# Patient Record
Sex: Female | Born: 2000
Health system: Southern US, Community
[De-identification: ages and names within clinical notes are randomized; demographics above are authoritative.]

## PROBLEM LIST (undated history)

## (undated) DIAGNOSIS — F329 Major depressive disorder, single episode, unspecified: Secondary | ICD-10-CM

## (undated) DIAGNOSIS — F419 Anxiety disorder, unspecified: Secondary | ICD-10-CM

## (undated) DIAGNOSIS — F4310 Post-traumatic stress disorder, unspecified: Secondary | ICD-10-CM

## (undated) DIAGNOSIS — F32A Depression, unspecified: Secondary | ICD-10-CM

---
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
## 2019-06-23 ENCOUNTER — Other Ambulatory Visit: Payer: Self-pay

## 2019-06-23 ENCOUNTER — Emergency Department (HOSPITAL_BASED_OUTPATIENT_CLINIC_OR_DEPARTMENT_OTHER): Payer: No Typology Code available for payment source

## 2019-06-23 ENCOUNTER — Encounter (HOSPITAL_BASED_OUTPATIENT_CLINIC_OR_DEPARTMENT_OTHER): Payer: Self-pay

## 2019-06-23 ENCOUNTER — Emergency Department (HOSPITAL_BASED_OUTPATIENT_CLINIC_OR_DEPARTMENT_OTHER)
Admission: EM | Admit: 2019-06-23 | Discharge: 2019-06-24 | Disposition: A | Discharge status: Home / Self Care | Payer: No Typology Code available for payment source | Attending: Emergency Medicine | Admitting: Emergency Medicine

## 2019-06-23 DIAGNOSIS — R1084 Generalized abdominal pain: Secondary | ICD-10-CM | POA: Diagnosis not present

## 2019-06-23 DIAGNOSIS — R112 Nausea with vomiting, unspecified: Secondary | ICD-10-CM | POA: Insufficient documentation

## 2019-06-23 DIAGNOSIS — F17210 Nicotine dependence, cigarettes, uncomplicated: Secondary | ICD-10-CM | POA: Insufficient documentation

## 2019-06-23 HISTORY — DX: Depression, unspecified: F32.A

## 2019-06-23 HISTORY — DX: Post-traumatic stress disorder, unspecified: F43.10

## 2019-06-23 HISTORY — DX: Anxiety disorder, unspecified: F41.9

## 2019-06-23 LAB — URINALYSIS, MICROSCOPIC (REFLEX)

## 2019-06-23 LAB — BASIC METABOLIC PANEL
Anion gap: 10 (ref 5–15)
BUN: 13 mg/dL (ref 6–20)
CO2: 22 mmol/L (ref 22–32)
Calcium: 9 mg/dL (ref 8.9–10.3)
Chloride: 103 mmol/L (ref 98–111)
Creatinine, Ser: 0.61 mg/dL (ref 0.44–1.00)
GFR calc Af Amer: >60 mL/min (ref >60)
GFR calc non Af Amer: >60 mL/min (ref >60)
Glucose, Bld: 95 mg/dL (ref 70–99)
Potassium: 4.1 mmol/L (ref 3.5–5.1)
Sodium: 135 mmol/L (ref 135–145)

## 2019-06-23 LAB — URINALYSIS, ROUTINE W REFLEX MICROSCOPIC
Bilirubin Urine: NEGATIVE
Glucose, UA: NEGATIVE mg/dL
Ketones, ur: 15 mg/dL — AB
Leukocytes,Ua: NEGATIVE
Nitrite: NEGATIVE
Protein, ur: NEGATIVE mg/dL
Specific Gravity, Urine: >1.03 — ABNORMAL HIGH (ref 1.005–1.030)
pH: 6 (ref 5.0–8.0)

## 2019-06-23 LAB — CBC
HCT: 47 % — ABNORMAL HIGH (ref 36.0–46.0)
Hemoglobin: 15 g/dL (ref 12.0–15.0)
MCH: 28.5 pg (ref 26.0–34.0)
MCHC: 31.9 g/dL (ref 30.0–36.0)
MCV: 89.4 fL (ref 80.0–100.0)
Platelets: 260 10*3/uL (ref 150–400)
RBC: 5.26 MIL/uL — ABNORMAL HIGH (ref 3.87–5.11)
RDW: 13.2 % (ref 11.5–15.5)
WBC: 11.8 10*3/uL — ABNORMAL HIGH (ref 4.0–10.5)
nRBC: 0 % (ref 0.0–0.2)

## 2019-06-23 LAB — PREGNANCY, URINE: Preg Test, Ur: NEGATIVE

## 2019-06-23 MED ORDER — ONDANSETRON HCL 4 MG/2ML IJ SOLN
4.0000 mg | Freq: Once | INTRAMUSCULAR | Status: AC
  Administered 2019-06-23: 4 mg via INTRAVENOUS
  Filled 2019-06-23: qty 2

## 2019-06-23 MED ORDER — MORPHINE SULFATE (PF) 4 MG/ML IV SOLN
4.0000 mg | Freq: Once | INTRAVENOUS | Status: AC
  Administered 2019-06-23: 4 mg via INTRAVENOUS
  Filled 2019-06-23: qty 1

## 2019-06-23 NOTE — ED Triage Notes (Signed)
Pt began vomiting approx 1400 today, expresses CP when vomiting. 6x vomiting, 1x diarrhea. Headache "all over" and abd pain central that radiates to sides.

## 2019-06-23 NOTE — ED Notes (Signed)
Pt unable to urinate at this time.  

## 2019-06-23 NOTE — ED Notes (Signed)
Attempted IV in Right AC unsuccessfully.

## 2019-06-24 MED ORDER — ONDANSETRON HCL 4 MG/2ML IJ SOLN
4.0000 mg | Freq: Once | INTRAMUSCULAR | Status: AC
  Administered 2019-06-24: 4 mg via INTRAVENOUS

## 2019-06-24 MED ORDER — IOHEXOL 300 MG/ML  SOLN
100.0000 mL | Freq: Once | INTRAMUSCULAR | Status: AC | PRN
  Administered 2019-06-24: 100 mL via INTRAVENOUS

## 2019-06-24 MED ORDER — ONDANSETRON HCL 4 MG/2ML IJ SOLN
INTRAMUSCULAR | Status: AC
  Filled 2019-06-24: qty 2

## 2019-06-24 MED ORDER — ONDANSETRON 8 MG PO TBDP: 8.0000 mg | ORAL_TABLET | Freq: Three times a day (TID) | ORAL | 0 refills | Status: AC | PRN

## 2019-06-24 NOTE — Discharge Instructions (Signed)
We saw you in the ER for the abdominal pain. °All the results in the ER are normal, labs and imaging. °We are not sure what is causing your symptoms. ° °The workup in the ER is not complete, and is limited to screening for life threatening and emergent conditions only, so please see a primary care doctor for further evaluation. ° °

## 2019-06-24 NOTE — ED Provider Notes (Signed)
[facility] EMERGENCY DEPARTMENT Provider Note   CSN: [id number] Arrival date & time: 06/23/19  2219     History Chief Complaint  Patient presents with  . Emesis    Carrie Fuller is a 19 y.o. female.  HPI    19 year old female comes in a chief complaint of abdominal pain, nausea and vomiting.  Patient reports that she started having abdominal pain this afternoon around 2 PM.  The pain is generalized, started in the epigastric region and has moved to the lower quadrants.  She has had associated vomiting x6 and one episode of loose bowel movement.  Patient is also having headache.  She denies any fevers, chills.  Patient denies any UTI-like symptoms.  She has no vaginal discharge and is currently on her period.  She denies any high risk sexual behavior or history of STDs.  No history of abdominal surgery or history of pelvic disorder.  Past Medical History:  Diagnosis Date  . Anxiety   . Depression   . PTSD (post-traumatic stress disorder)     Patient Active Problem List   Diagnosis Date Noted  . MDD (major depressive disorder), recurrent episode, severe (HCC) 09/29/2018  . Major depressive disorder, recurrent episode, severe, with psychotic behavior (HCC) 09/29/2018  . Left wrist injury 07/03/2011    History reviewed. No pertinent surgical history.   OB History   No obstetric history on file.     Family History  Problem Relation Age of Onset  . Sudden death Neg Hx   . Hypertension Neg Hx   . Hyperlipidemia Neg Hx   . Heart attack Neg Hx   . Diabetes Neg Hx     Social History   Tobacco Use  . Smoking status: Current Every Day Smoker    Packs/day: 2.00    Types: Cigarettes  . Smokeless tobacco: Never Used  Substance Use Topics  . Alcohol use: Yes  . Drug use: Not Currently    Types: Marijuana    Comment: former cocaine abuse    Home Medications Prior to Admission medications   Medication Sig Start Date End Date Taking? Authorizing Provider    escitalopram (LEXAPRO) 20 MG tablet Take 1 tablet (20 mg total) by mouth daily. 10/02/18  Yes Malvin Johns, MD  sertraline (ZOLOFT) 25 MG tablet Take 25 mg by mouth daily.   Yes [provider]  ondansetron (ZOFRAN ODT) 8 MG disintegrating tablet Take 1 tablet (8 mg total) by mouth every 8 (eight) hours as needed for nausea. 06/24/19   Derwood Kaplan, MD  Prenat Vit-Fe Gly Cys-FA-Omega (ENBRACE HR) CAPS Take 1 capsule by mouth daily at 12 noon. May get from [email or web address] 10/01/18   Malvin Johns, MD    Allergies    Patient has no known allergies.  Review of Systems   Review of Systems  Constitutional: Positive for activity change.  Gastrointestinal: Positive for abdominal pain, nausea and vomiting.  Genitourinary: Negative for dysuria.  All other systems reviewed and are negative.   Physical Exam Updated Vital Signs BP 103/86 (BP Location: Right Arm)   Pulse (!) 107   Temp 98.2 F (36.8 C) (Oral)   Resp (!) 22   Ht 5\' 5"  (1.651 m)   Wt 90.7 kg   LMP 06/23/2019   SpO2 100%   BMI 33.28 kg/m   Physical Exam Vitals and nursing note reviewed.  Constitutional:      Appearance: She is well-developed.  HENT:     Head: Normocephalic and atraumatic.  Cardiovascular:     Rate and Rhythm: Normal rate.  Pulmonary:     Effort: Pulmonary effort is normal.  Abdominal:     General: Bowel sounds are normal.     Tenderness: There is abdominal tenderness. There is guarding.     Comments: Patient has tenderness over the right upper and right lower quadrant with positive McBurney's.  Musculoskeletal:     Cervical back: Normal range of motion and neck supple.  Skin:    General: Skin is warm and dry.  Neurological:     Mental Status: She is alert and oriented to person, place, and time.     ED Results / Procedures / Treatments   Labs (all labs ordered are listed, but only abnormal results are displayed) Labs Reviewed  CBC - Abnormal; Notable for the following components:       Result Value   WBC 11.8 (*)    RBC 5.26 (*)    HCT 47.0 (*)    All other components within normal limits  URINALYSIS, ROUTINE W REFLEX MICROSCOPIC - Abnormal; Notable for the following components:   Specific Gravity, Urine >1.030 (*)    Hgb urine dipstick TRACE (*)    Ketones, ur 15 (*)    All other components within normal limits  URINALYSIS, MICROSCOPIC (REFLEX) - Abnormal; Notable for the following components:   Bacteria, UA FEW (*)    All other components within normal limits  BASIC METABOLIC PANEL  PREGNANCY, URINE    EKG None  Radiology CT ABDOMEN PELVIS W CONTRAST  Result Date: 06/24/2019 CLINICAL DATA:  Right lower quadrant pain for several hours with nausea and vomiting EXAM: CT ABDOMEN AND PELVIS WITH CONTRAST TECHNIQUE: Multidetector CT imaging of the abdomen and pelvis was performed using the standard protocol following bolus administration of intravenous contrast. CONTRAST:  187mL OMNIPAQUE IOHEXOL 300 MG/ML  SOLN COMPARISON:  None. FINDINGS: Lower chest: No acute abnormality. Hepatobiliary: No focal liver abnormality is seen. No gallstones, gallbladder wall thickening, or biliary dilatation. Pancreas: Unremarkable. No pancreatic ductal dilatation or surrounding inflammatory changes. Spleen: Normal in size without focal abnormality. Adrenals/Urinary Tract: Adrenal glands are within normal limits. Kidneys demonstrate a normal enhancement pattern bilaterally. No renal calculi or obstructive changes are seen. The ureters are unremarkable. The bladder is decompressed. Stomach/Bowel: The appendix is well visualized and within normal limits. No obstructive or inflammatory changes of the colon are seen. Small bowel and stomach is within normal limits. Vascular/Lymphatic: No significant vascular findings are present. No enlarged abdominal or pelvic lymph nodes. Reproductive: Uterus is within normal limits. Dominant 2 cm cyst is noted in the right ovary. No changes to suggest free fluid  are noted. Tampon is noted in the vaginal wall. Other: No abdominal wall hernia or abnormality. No abdominopelvic ascites. Musculoskeletal: L5 pars defects are noted bilaterally. No significant anterolisthesis is seen. No other bony abnormality is noted. IMPRESSION: Normal-appearing appendix. 2 cm right ovarian cyst without complicating factors. 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 Electronically Signed   By: Inez Catalina M.D.   On: 06/24/2019 00:45    Procedures Procedures (including critical care time)  Medications Ordered in ED Medications  ondansetron (ZOFRAN) injection 4 mg (has no administration in time range)  ondansetron (ZOFRAN) injection 4 mg (4 mg Intravenous Given 06/23/19 2355)  morphine 4 MG/ML injection 4 mg (4 mg Intravenous Given 06/23/19 2356)  iohexol (OMNIPAQUE) 300 MG/ML solution 100 mL (100 mLs Intravenous Contrast Given 06/24/19 0031)    ED Course  I have reviewed the triage vital signs and the nursing notes.  Pertinent labs & imaging results that were available during my care of the patient were reviewed by me and considered in my medical decision making (see chart for details).    MDM Rules/Calculators/A&P                      19 year old female comes in a chief complaint of abdominal pain. She is complaining of generalized abdominal pain, however on exam she is noted to have right lower quadrant tenderness.  She also reports that the pain is radiated more towards the lower part of her abdomen.  She has no UTI-like symptoms and denies any vaginal discharge or hypersexual behavior.  No clinical concerns for STD after the history.  We decided to proceed with CT scan to rule out appendicitis and it is negative.  P.o. challenge has been passed by patient.  She was reassessed and reported that her pain had resolved.   She is stable for discharge with strict ER return precautions.  Final Clinical Impression(s) / ED Diagnoses Final diagnoses:    Non-intractable vomiting with nausea, unspecified vomiting type  Generalized abdominal pain    Rx / DC Orders ED Discharge Orders         Ordered    ondansetron (ZOFRAN ODT) 8 MG disintegrating tablet  Every 8 hours PRN     06/24/19 0101           Derwood Kaplan, MD 06/24/19 0121

## 2019-06-24 NOTE — ED Notes (Signed)
Pt transported to CT ?

## 2020-09-14 ENCOUNTER — Emergency Department (HOSPITAL_BASED_OUTPATIENT_CLINIC_OR_DEPARTMENT_OTHER)
Admission: EM | Admit: 2020-09-14 | Discharge: 2020-09-14 | Disposition: A | Discharge status: Home / Self Care | Payer: Medicaid Other | Attending: Emergency Medicine | Admitting: Emergency Medicine

## 2020-09-14 ENCOUNTER — Other Ambulatory Visit: Payer: Self-pay

## 2020-09-14 ENCOUNTER — Encounter (HOSPITAL_BASED_OUTPATIENT_CLINIC_OR_DEPARTMENT_OTHER): Payer: Self-pay

## 2020-09-14 ENCOUNTER — Emergency Department (HOSPITAL_BASED_OUTPATIENT_CLINIC_OR_DEPARTMENT_OTHER): Payer: Medicaid Other

## 2020-09-14 DIAGNOSIS — F17210 Nicotine dependence, cigarettes, uncomplicated: Secondary | ICD-10-CM | POA: Insufficient documentation

## 2020-09-14 DIAGNOSIS — M25531 Pain in right wrist: Secondary | ICD-10-CM | POA: Insufficient documentation

## 2020-09-14 MED ORDER — NAPROXEN 500 MG PO TABS: 500.0000 mg | ORAL_TABLET | Freq: Two times a day (BID) | ORAL | 0 refills | Status: AC

## 2020-09-14 NOTE — ED Triage Notes (Signed)
Pt c/o pain to right wrist x 3 weeks-denies injury-NAD-steady gait

## 2020-09-14 NOTE — ED Notes (Signed)
Ace wrap applied to right wrist. Pt A&OX4, ambulatory at d/c with independent steady gait, NAD. Pt verbalized understanding of d/c instructions, prescription and follow up care.

## 2020-09-14 NOTE — ED Provider Notes (Signed)
[facility] EMERGENCY DEPARTMENT Provider Note   CSN: [id number] Arrival date & time: 09/14/20  1912     History Chief Complaint  Patient presents with   Wrist Pain    Carrie Fuller is a 20 y.o. female presenting for evaluation of right wrist pain.  Patient states of the past several weeks, she has noticed a bump on her right radial wrist.  This area is uncomfortable with palpation and ulnar deviation.  No pain other times.  She has not taken anything for it including Tylenol ibuprofen.  It is not getting larger.  No trauma or injury.  She denies numbness or tingling in the hand.  No symptoms on the left side.  HPI     Past Medical History:  Diagnosis Date   Anxiety    Depression    PTSD (post-traumatic stress disorder)     Patient Active Problem List   Diagnosis Date Noted   MDD (major depressive disorder), recurrent episode, severe (HCC) 09/29/2018   Major depressive disorder, recurrent episode, severe, with psychotic behavior (HCC) 09/29/2018   Left wrist injury 07/03/2011    History reviewed. No pertinent surgical history.   OB History   No obstetric history on file.     Family History  Problem Relation Age of Onset   Sudden death Neg Hx    Hypertension Neg Hx    Hyperlipidemia Neg Hx    Heart attack Neg Hx    Diabetes Neg Hx     Social History   Tobacco Use   Smoking status: Every Day    Packs/day: 2.00    Pack years: 0.00    Types: Cigarettes   Smokeless tobacco: Never  Vaping Use   Vaping Use: Every day  Substance Use Topics   Alcohol use: Yes    Comment: occ   Drug use: Not Currently    Types: Marijuana, Cocaine    Home Medications Prior to Admission medications   Medication Sig Start Date End Date Taking? Authorizing Provider  naproxen (NAPROSYN) 500 MG tablet Take 1 tablet (500 mg total) by mouth 2 (two) times daily with a meal. 09/14/20  Yes Levy Wellman, PA-C  escitalopram (LEXAPRO) 20 MG tablet Take 1 tablet (20 mg  total) by mouth daily. 10/02/18   Malvin Johns, MD  ondansetron (ZOFRAN ODT) 8 MG disintegrating tablet Take 1 tablet (8 mg total) by mouth every 8 (eight) hours as needed for nausea. 06/24/19   Derwood Kaplan, MD  Prenat Vit-Fe Gly Cys-FA-Omega (ENBRACE HR) CAPS Take 1 capsule by mouth daily at 12 noon. May get from [email or web address] 10/01/18   Malvin Johns, MD  sertraline (ZOLOFT) 25 MG tablet Take 25 mg by mouth daily.    [provider]    Allergies    Patient has no known allergies.  Review of Systems   Review of Systems  Musculoskeletal:  Positive for arthralgias.  Neurological:  Negative for numbness.   Physical Exam Updated Vital Signs BP 124/87 (BP Location: Left Arm)   Pulse (!) 105   Temp 98.2 F (36.8 C) (Oral)   Resp 14   Ht 5\' 5"  (1.651 m)   Wt 87.1 kg   LMP 08/24/2020 (Exact Date)   SpO2 100%   BMI 31.95 kg/m   Physical Exam Vitals and nursing note reviewed.  Constitutional:      General: She is not in acute distress.    Appearance: She is well-developed.  HENT:     Head: Normocephalic and atraumatic.  Eyes:     Extraocular Movements: Extraocular movements intact.  Cardiovascular:     Rate and Rhythm: Normal rate.  Pulmonary:     Effort: Pulmonary effort is normal.  Abdominal:     General: There is no distension.  Musculoskeletal:        General: Normal range of motion.     Cervical back: Normal range of motion.     Comments: Small, less than pea-sized, lesion of the right radial wrist.  No erythema or warmth.  Mild tenderness.  No fluctuance.  Full active range of motion of the thumb without difficulty.  Full active range of motion of the wrist.  Radial pulse 2+ bilaterally.  Skin:    General: Skin is warm.     Findings: No rash.  Neurological:     Mental Status: She is alert and oriented to person, place, and time.    ED Results / Procedures / Treatments   Labs (all labs ordered are listed, but only abnormal results are displayed) Labs  Reviewed - No data to display  EKG None  Radiology DG Wrist Complete Right  Result Date: 09/14/2020 CLINICAL DATA:  Right wrist palpable abnormality, no acute trauma, initial encounter EXAM: RIGHT WRIST - COMPLETE 3+ VIEW COMPARISON:  None. FINDINGS: There is no evidence of fracture or dislocation. There is no evidence of arthropathy or other focal bone abnormality. Soft tissues are unremarkable. IMPRESSION: No acute abnormality noted. Electronically Signed   By: Alcide Clever M.D.   On: 09/14/2020 20:17    Procedures Procedures   Medications Ordered in ED Medications - No data to display  ED Course  I have reviewed the triage vital signs and the nursing notes.  Pertinent labs & imaging results that were available during my care of the patient were reviewed by me and considered in my medical decision making (see chart for details).    MDM Rules/Calculators/A&P                          Patient presented for evaluation of right wrist pain.  On exam, patient is nontoxic.  She is neurovascular intact.  No signs of infection.  X-ray obtained from triage viewed and independently interpreted by me, no fracture or dislocation.  No lesion or bone spur.  Consider ganglion cyst of the wrist.  Exam is not consistent with carpal tunnel syndrome, discussed findings with patient and mom.  Discussed symptomatic treatment anti-inflammatories.  Encourage follow-up with hand if symptoms not improving.  At this time, patient appears safe for discharge.  Return precautions given.  Patient states she understands and agrees to plan.  Final Clinical Impression(s) / ED Diagnoses Final diagnoses:  Acute pain of right wrist    Rx / DC Orders ED Discharge Orders          Ordered    naproxen (NAPROSYN) 500 MG tablet  2 times daily with meals        09/14/20 2032             Alveria Apley, PA-C 09/14/20 2034    Virgina Norfolk, DO 09/14/20 2303

## 2020-09-14 NOTE — Discharge Instructions (Addendum)
Your x-ray was normal today.  You should treat your with symptomatically and follow-up with a hand doctor listed below as needed for further evaluation. Take naproxen 2 times a day with meals.  Do not take other anti-inflammatories at the same time (Advil, Motrin, ibuprofen, Aleve). You may supplement with Tylenol if you need further pain control. Use ice packs or heating pads if this helps control your pain. Use an ace wrap as needed for comfort.  Return to the emergency room with any new, worsening, concerning symptoms

## 2020-12-30 ENCOUNTER — Emergency Department (HOSPITAL_BASED_OUTPATIENT_CLINIC_OR_DEPARTMENT_OTHER)
Admission: EM | Admit: 2020-12-30 | Discharge: 2020-12-30 | Disposition: A | Discharge status: Home / Self Care | Payer: Medicaid Other | Attending: Emergency Medicine | Admitting: Emergency Medicine

## 2020-12-30 ENCOUNTER — Encounter (HOSPITAL_BASED_OUTPATIENT_CLINIC_OR_DEPARTMENT_OTHER): Payer: Self-pay

## 2020-12-30 ENCOUNTER — Other Ambulatory Visit: Payer: Self-pay

## 2020-12-30 DIAGNOSIS — F17210 Nicotine dependence, cigarettes, uncomplicated: Secondary | ICD-10-CM | POA: Insufficient documentation

## 2020-12-30 DIAGNOSIS — K0889 Other specified disorders of teeth and supporting structures: Secondary | ICD-10-CM | POA: Insufficient documentation

## 2020-12-30 MED ORDER — OXYCODONE-ACETAMINOPHEN 5-325 MG PO TABS
1.0000 | ORAL_TABLET | Freq: Once | ORAL | Status: AC
  Administered 2020-12-30: 1 via ORAL
  Filled 2020-12-30: qty 1

## 2020-12-30 NOTE — ED Provider Notes (Signed)
[facility] EMERGENCY DEPARTMENT Provider Note   CSN: [id number] Arrival date & time: 12/30/20  1301     History Chief Complaint  Patient presents with  . Dental Pain    Carrie Fuller is a 20 y.o. female who presents the emergency department for further evaluation of left upper dental pain that has been constant and worsening over the last 2 weeks.  She states that she recently saw a dentist who stated she had to have her left upper wisdom tooth removed but did not have the money to go through with the procedure.  Plan is to get it pulled next week.  She denies fever, chills, sore throat, drainage from the tooth, trouble swallowing, trouble talking, trouble breathing.  She rates her tooth pain 10/10 in severity.  Pain radiates to the left ear.  The history is provided by the patient. No language interpreter was used.  Dental Pain     Past Medical History:  Diagnosis Date  . Anxiety   . Depression   . PTSD (post-traumatic stress disorder)     Patient Active Problem List   Diagnosis Date Noted  . MDD (major depressive disorder), recurrent episode, severe (HCC) 09/29/2018  . Major depressive disorder, recurrent episode, severe, with psychotic behavior (HCC) 09/29/2018  . Left wrist injury 07/03/2011    History reviewed. No pertinent surgical history.   OB History   No obstetric history on file.     Family History  Problem Relation Age of Onset  . Sudden death Neg Hx   . Hypertension Neg Hx   . Hyperlipidemia Neg Hx   . Heart attack Neg Hx   . Diabetes Neg Hx     Social History   Tobacco Use  . Smoking status: Every Day    Packs/day: 2.00    Types: Cigarettes  . Smokeless tobacco: Never  Vaping Use  . Vaping Use: Every day  Substance Use Topics  . Alcohol use: Yes    Comment: occ  . Drug use: Not Currently    Types: Marijuana, Cocaine    Home Medications Prior to Admission medications   Medication Sig Start Date End Date Taking? Authorizing  Provider  escitalopram (LEXAPRO) 20 MG tablet Take 1 tablet (20 mg total) by mouth daily. 10/02/18   Malvin Johns, MD  naproxen (NAPROSYN) 500 MG tablet Take 1 tablet (500 mg total) by mouth 2 (two) times daily with a meal. 09/14/20   Caccavale, Sophia, PA-C  ondansetron (ZOFRAN ODT) 8 MG disintegrating tablet Take 1 tablet (8 mg total) by mouth every 8 (eight) hours as needed for nausea. 06/24/19   Derwood Kaplan, MD  Prenat Vit-Fe Gly Cys-FA-Omega (ENBRACE HR) CAPS Take 1 capsule by mouth daily at 12 noon. May get from [email or web address] 10/01/18   Malvin Johns, MD  sertraline (ZOLOFT) 25 MG tablet Take 25 mg by mouth daily.    [provider]    Allergies    Patient has no known allergies.  Review of Systems   Review of Systems  All other systems reviewed and are negative.  Physical Exam Updated Vital Signs BP (!) 130/91 (BP Location: Left Arm)   Pulse 88   Temp 98 F (36.7 C) (Oral)   Resp 16   Ht 5\' 5"  (1.651 m)   Wt 88 kg   SpO2 99%   BMI 32.28 kg/m   Physical Exam Vitals reviewed.  Constitutional:      Appearance: Normal appearance.  HENT:     Head:  Normocephalic and atraumatic.     Mouth/Throat:     Pharynx: Oropharynx is clear.     Tonsils: No tonsillar exudate or tonsillar abscesses.     Comments: No pharyngeal erythema or swelling. Left upper wisdom tooth is protruding through the gumline but does not have any surrounding erythema or swelling. No obvious abscess.  No trismus.  She is speaking in full sentences Eyes:     General:        Right eye: No discharge.        Left eye: No discharge.     Conjunctiva/sclera: Conjunctivae normal.  Pulmonary:     Effort: Pulmonary effort is normal.  Skin:    General: Skin is warm and dry.     Findings: No rash.  Neurological:     General: No focal deficit present.     Mental Status: She is alert.  Psychiatric:        Mood and Affect: Mood normal.        Behavior: Behavior normal.    ED Results / Procedures /  Treatments   Labs (all labs ordered are listed, but only abnormal results are displayed) Labs Reviewed - No data to display  EKG None  Radiology No results found.  Procedures Procedures   Medications Ordered in ED Medications  oxyCODONE-acetaminophen (PERCOCET/ROXICET) 5-325 MG per tablet 1 tablet (1 tablet Oral Given 12/30/20 1356)    ED Course  I have reviewed the triage vital signs and the nursing notes.  Pertinent labs & imaging results that were available during my care of the patient were reviewed by me and considered in my medical decision making (see chart for details).    MDM Rules/Calculators/A&P                          Carrie Fuller is a 20 y.o. female who presents to the ED for further evaluation of dental pain.  Patient is not immunocompromised and has been afebrile.  She is maintained a patent airway her entire stay.  Low suspicion for deep space infection or any concern for airway compromise at this time.  There did not appear to be any tooth fracture, avulsion, or bleeding socket on my physical exam.  No evidence of retropharyngeal abscess, peritonsillar abscess, Ludwig's angina, or abscess.  Instructed patient to use Tylenol/ibuprofen for pain and follow-up with a dentist for tooth extraction.  She is hemodynamically stable and safe for discharge Strict return precautions given.   Final Clinical Impression(s) / ED Diagnoses Final diagnoses:  Tooth pain    Rx / DC Orders ED Discharge Orders     None        Teressa Lower, [place] 12/30/20 1410    Pollyann Savoy, MD 12/30/20 [phone]

## 2020-12-30 NOTE — ED Triage Notes (Signed)
Pt c/o left upper wisdom tooth pain x 3 weeks-NAD-steady gait

## 2020-12-30 NOTE — Discharge Instructions (Addendum)
You were seen and evaluated in the emergency department today for evaluation of dental pain.  As we discussed, this does not appear to be infected at this time.  We have given you a dose of pain medication in the department.  Please start taking Tylenol/ibuprofen with food on a regular basis for adequate pain control.  Please follow-up with your dentist for wisdom tooth extraction.  Please return to the emergency department for experience worsening pain, trouble breathing, trouble swallowing, trouble talking, trouble opening her mouth, or any other concerns you may have.

## 2021-04-11 IMAGING — CT CT ABD-PELV W/ CM
2 of 4 series · 16 of 46 positions shown, 18 images · IV contrast (Omnipaque)
Comparison: None.

CLINICAL DATA: Right lower quadrant pain for several hours with
nausea and vomiting

EXAM:
CT ABDOMEN AND PELVIS WITH CONTRAST
TECHNIQUE: Multidetector CT imaging of the abdomen and pelvis was performed
using the standard protocol following bolus administration of
intravenous contrast.
CONTRAST:  100mL OMNIPAQUE IOHEXOL 300 MG/ML  SOLN

[Series 2: axial st · axial · 0.72mm/px · z∈[+54,+484]mm · 13 of 94 slices shown, 15 images]
[im 4/94  soft-tissue]
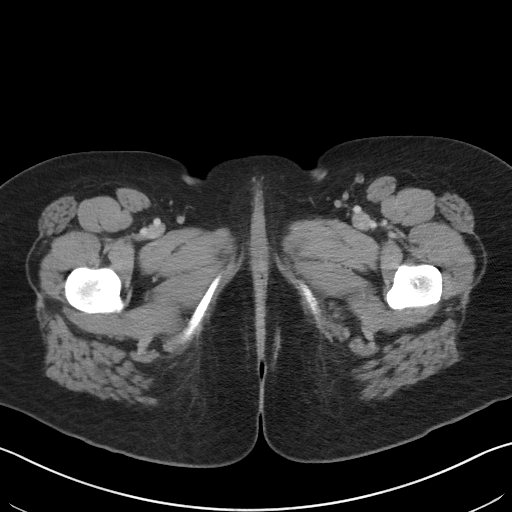
[im 4/94  bone]
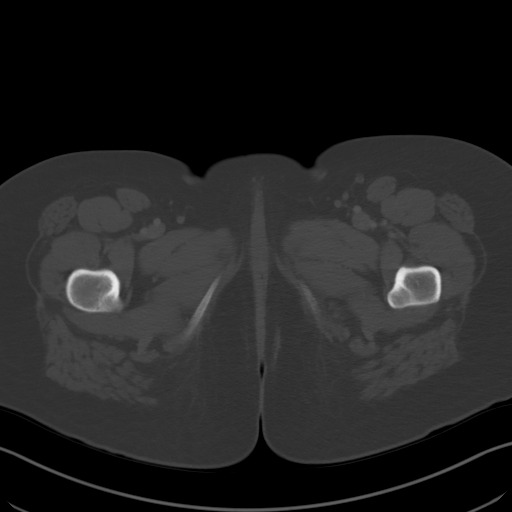
[im 12/94  soft-tissue]
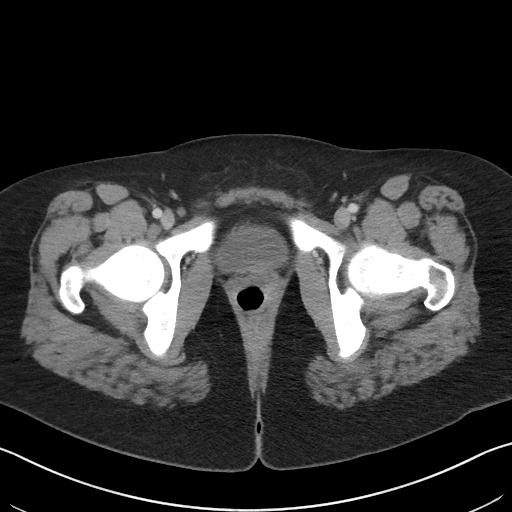
[im 20/94  soft-tissue]
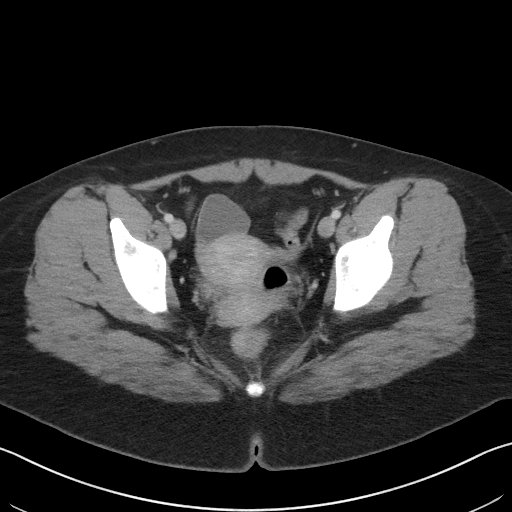
[im 28/94  soft-tissue]
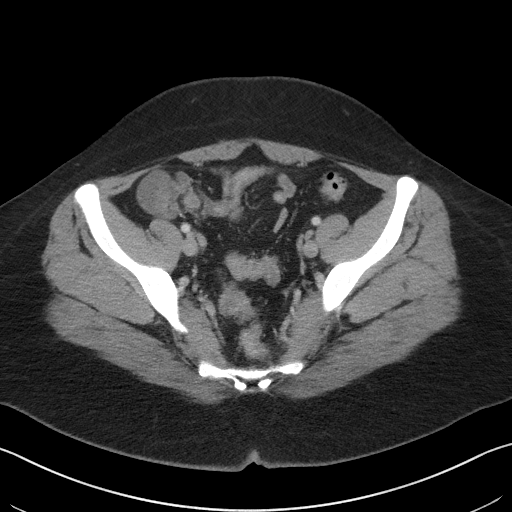
[im 32/94  soft-tissue]
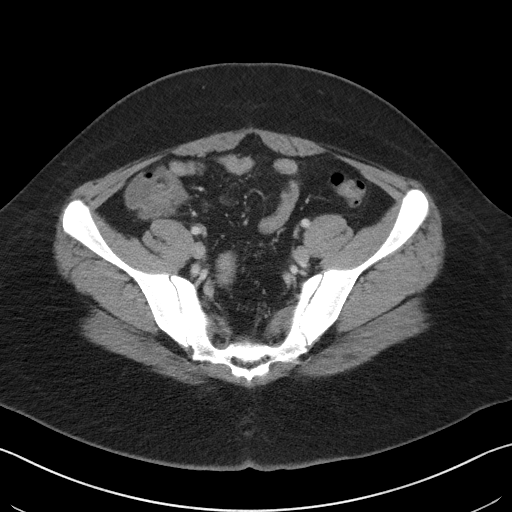
[im 39/94  soft-tissue]
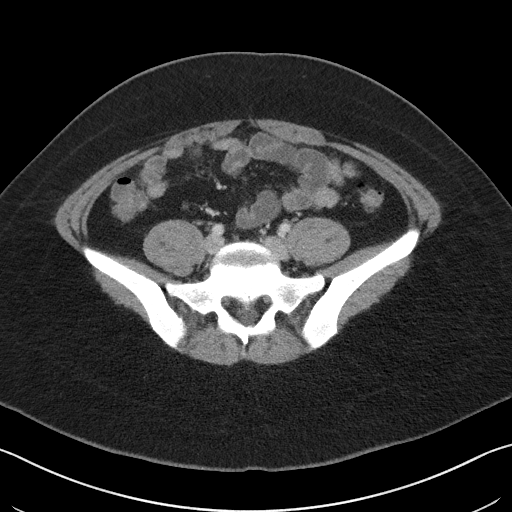
[im 47/94  soft-tissue]
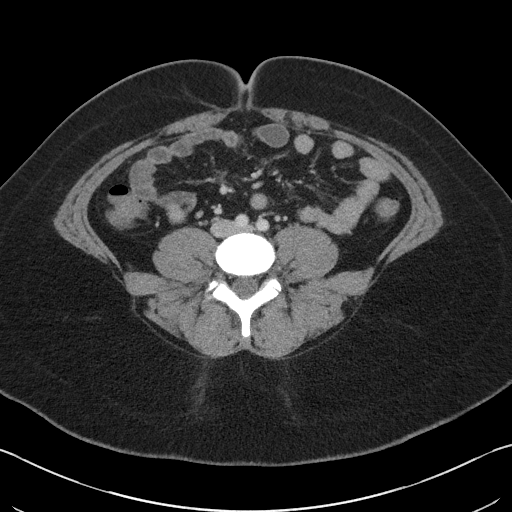
[im 55/94  soft-tissue]
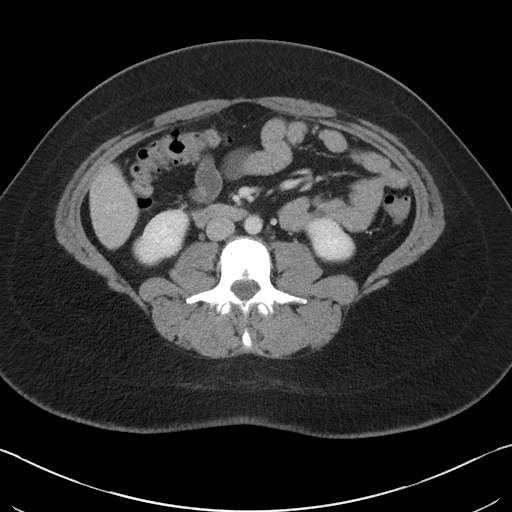
[im 63/94  soft-tissue]
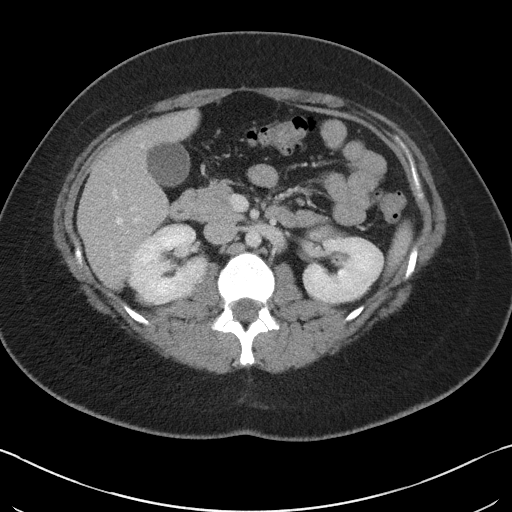
[im 63/94  bone]
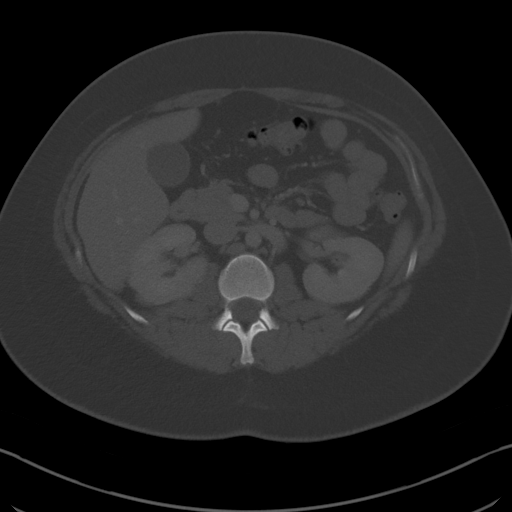
[im 66/94  soft-tissue]
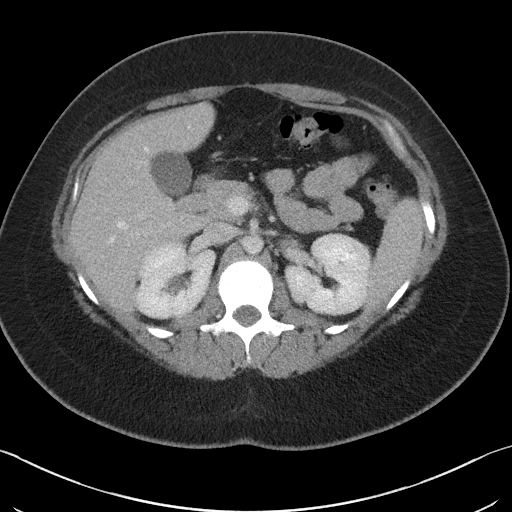
[im 74/94  soft-tissue]
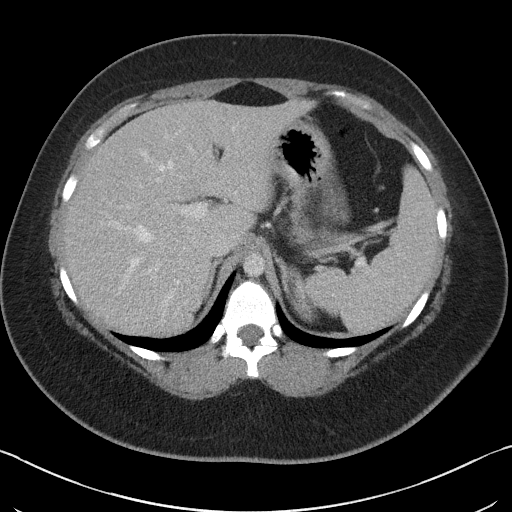
[im 82/94  soft-tissue]
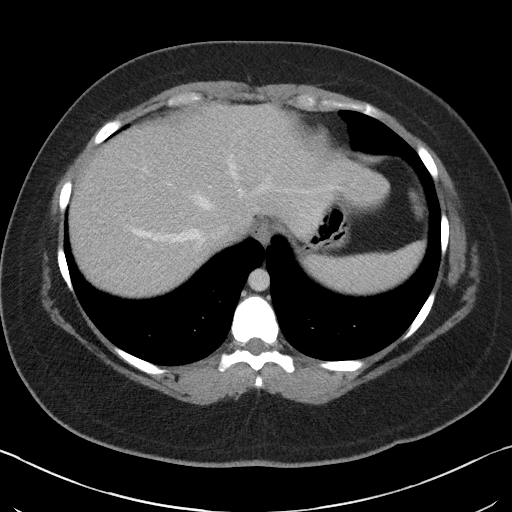
[im 90/94  soft-tissue]
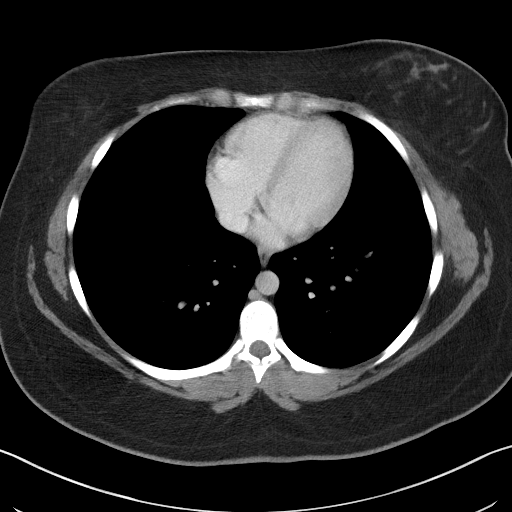

[Series 5: coronal st · coronal · 0.68mm/px · 3 of 101 slices shown]
[im 34/101  soft-tissue]
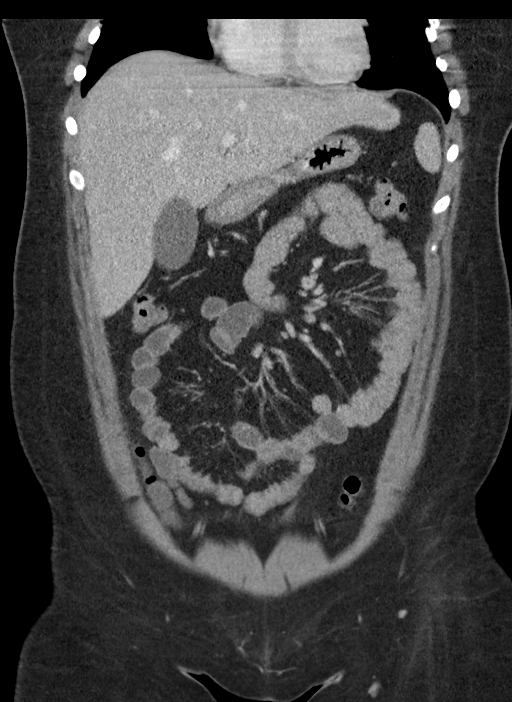
[im 45/101  soft-tissue]
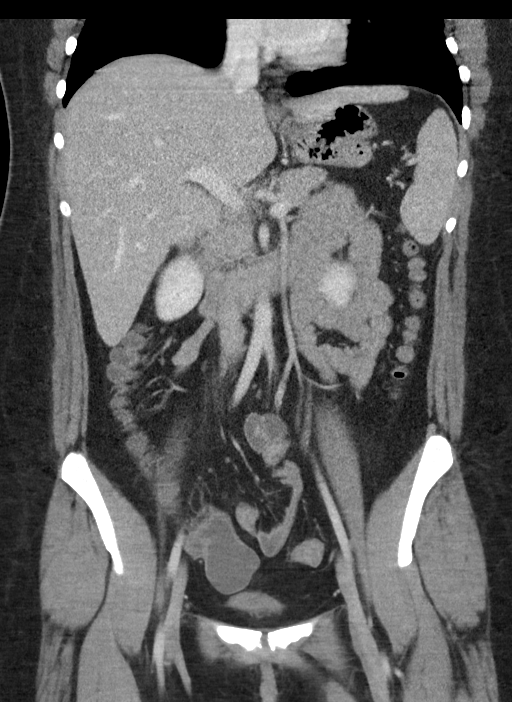
[im 56/101  soft-tissue]
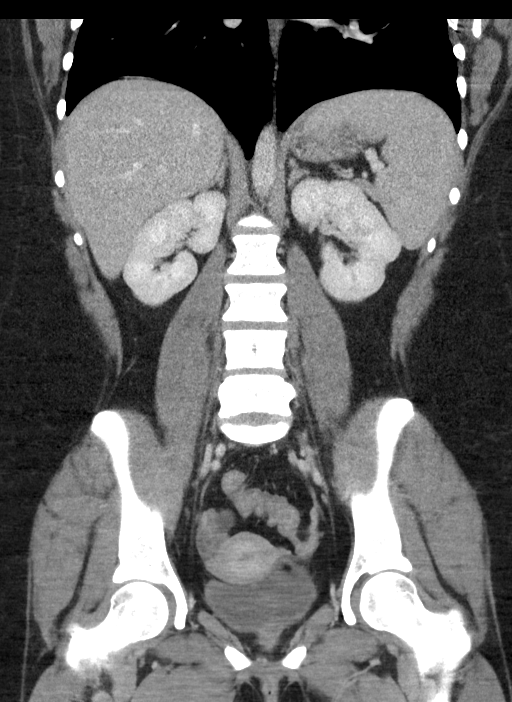

[16 of 46 positions shown; findings below may reference images not displayed]

FINDINGS: Lower chest: No acute abnormality.

Hepatobiliary: No focal liver abnormality is seen. No gallstones,
gallbladder wall thickening, or biliary dilatation.

Pancreas: Unremarkable. No pancreatic ductal dilatation or
surrounding inflammatory changes.

Spleen: Normal in size without focal abnormality.

Adrenals/Urinary Tract: Adrenal glands are within normal limits.
Kidneys demonstrate a normal enhancement pattern bilaterally. No
renal calculi or obstructive changes are seen. The ureters are
unremarkable. The bladder is decompressed.

Stomach/Bowel: The appendix is well visualized and within normal
limits. No obstructive or inflammatory changes of the colon are
seen. Small bowel and stomach is within normal limits.

Vascular/Lymphatic: No significant vascular findings are present. No
enlarged abdominal or pelvic lymph nodes.

Reproductive: Uterus is within normal limits. Dominant 2 cm cyst is
noted in the right ovary. No changes to suggest free fluid are
noted. Tampon is noted in the vaginal wall.

Other: No abdominal wall hernia or abnormality. No abdominopelvic
ascites.

Musculoskeletal: L5 pars defects are noted bilaterally. No
significant anterolisthesis is seen. No other bony abnormality is
noted.
IMPRESSION: Normal-appearing appendix.

2 cm right ovarian cyst without complicating factors.

Bilateral pars defects at L5 without acute abnormality.
